# Patient Record
Sex: Male | Born: 1965 | Race: Asian | Hispanic: No | Marital: Married | Smoking: Current some day smoker
Health system: Southern US, Community
[De-identification: ages and names within clinical notes are randomized; demographics above are authoritative.]

## PROBLEM LIST (undated history)

## (undated) DIAGNOSIS — N200 Calculus of kidney: Secondary | ICD-10-CM

## (undated) DIAGNOSIS — G4700 Insomnia, unspecified: Secondary | ICD-10-CM

## (undated) DIAGNOSIS — I10 Essential (primary) hypertension: Secondary | ICD-10-CM

## (undated) DIAGNOSIS — K219 Gastro-esophageal reflux disease without esophagitis: Secondary | ICD-10-CM

## (undated) DIAGNOSIS — E039 Hypothyroidism, unspecified: Secondary | ICD-10-CM

## (undated) HISTORY — DX: Insomnia, unspecified: G47.00

## (undated) HISTORY — DX: Essential (primary) hypertension: I10

## (undated) HISTORY — DX: Gastro-esophageal reflux disease without esophagitis: K21.9

---

## 2010-02-11 ENCOUNTER — Ambulatory Visit: Payer: Self-pay | Admitting: Otolaryngology

## 2010-02-12 ENCOUNTER — Emergency Department: Payer: Self-pay | Admitting: Emergency Medicine

## 2010-02-13 ENCOUNTER — Ambulatory Visit: Payer: Self-pay | Admitting: Otolaryngology

## 2010-03-12 ENCOUNTER — Ambulatory Visit: Payer: Self-pay | Admitting: Otolaryngology

## 2010-04-16 ENCOUNTER — Ambulatory Visit: Payer: Self-pay | Admitting: Otolaryngology

## 2010-05-29 ENCOUNTER — Ambulatory Visit: Payer: Self-pay | Admitting: Internal Medicine

## 2010-10-03 HISTORY — PX: OTHER SURGICAL HISTORY: SHX169

## 2010-10-18 ENCOUNTER — Ambulatory Visit: Payer: Self-pay | Admitting: Otolaryngology

## 2010-10-24 ENCOUNTER — Ambulatory Visit: Payer: Self-pay | Admitting: Otolaryngology

## 2010-10-25 LAB — PATHOLOGY REPORT

## 2010-10-28 HISTORY — PX: OTHER SURGICAL HISTORY: SHX169

## 2013-05-14 ENCOUNTER — Emergency Department: Payer: Self-pay | Admitting: Emergency Medicine

## 2013-05-14 LAB — COMPREHENSIVE METABOLIC PANEL WITH GFR
Albumin: 3.8 g/dL
Alkaline Phosphatase: 66 U/L
Anion Gap: 7
BUN: 29 mg/dL — ABNORMAL HIGH
Bilirubin,Total: 0.8 mg/dL
Calcium, Total: 8.8 mg/dL
Chloride: 108 mmol/L — ABNORMAL HIGH
Co2: 20 mmol/L — ABNORMAL LOW
Creatinine: 1.5 mg/dL — ABNORMAL HIGH
EGFR (African American): >60
EGFR (Non-African Amer.): 55 — ABNORMAL LOW
Glucose: 105 mg/dL — ABNORMAL HIGH
Osmolality: 276
Potassium: 5.1 mmol/L
SGOT(AST): 33 U/L
SGPT (ALT): 27 U/L
Sodium: 135 mmol/L — ABNORMAL LOW
Total Protein: 7.4 g/dL

## 2013-05-14 LAB — URINALYSIS, COMPLETE
Bacteria: NONE SEEN
Bilirubin,UR: NEGATIVE
Blood: NEGATIVE
Glucose,UR: NEGATIVE mg/dL (ref 0–75)
Ketone: NEGATIVE
Ph: 5 (ref 4.5–8.0)
RBC,UR: 1 /HPF (ref 0–5)
Specific Gravity: 1.019 (ref 1.003–1.030)

## 2013-05-14 LAB — CBC
HCT: 40.1 %
HGB: 13.5 g/dL
MCH: 31.3 pg
MCHC: 33.6 g/dL
MCV: 93 fL
Platelet: 270 x10 3/mm 3
RBC: 4.3 x10 6/mm 3 — ABNORMAL LOW
RDW: 13 %
WBC: 10.9 x10 3/mm 3 — ABNORMAL HIGH

## 2013-05-16 ENCOUNTER — Emergency Department: Payer: Self-pay | Admitting: Emergency Medicine

## 2013-05-16 LAB — CBC
HCT: 37.8 % — ABNORMAL LOW (ref 40.0–52.0)
HGB: 13 g/dL (ref 13.0–18.0)
MCH: 31.9 pg (ref 26.0–34.0)
MCHC: 34.4 g/dL (ref 32.0–36.0)
MCV: 93 fL (ref 80–100)
Platelet: 246 10*3/uL (ref 150–440)
RBC: 4.09 10*6/uL — ABNORMAL LOW (ref 4.40–5.90)
RDW: 13 % (ref 11.5–14.5)
WBC: 9.9 10*3/uL (ref 3.8–10.6)

## 2013-05-16 LAB — URINALYSIS, COMPLETE
Bacteria: NONE SEEN
Bilirubin,UR: NEGATIVE
Blood: NEGATIVE
Squamous Epithelial: <1

## 2013-05-16 LAB — BASIC METABOLIC PANEL
Anion Gap: 9 (ref 7–16)
BUN: 21 mg/dL — ABNORMAL HIGH (ref 7–18)
Calcium, Total: 8.9 mg/dL (ref 8.5–10.1)
Co2: 24 mmol/L (ref 21–32)
Creatinine: 1.49 mg/dL — ABNORMAL HIGH (ref 0.60–1.30)
EGFR (African American): >60
Glucose: 98 mg/dL (ref 65–99)
Osmolality: 280 (ref 275–301)
Sodium: 139 mmol/L (ref 136–145)

## 2014-01-10 ENCOUNTER — Ambulatory Visit: Payer: Self-pay | Admitting: Otolaryngology

## 2014-12-14 ENCOUNTER — Other Ambulatory Visit: Payer: Self-pay | Admitting: Internal Medicine

## 2014-12-14 DIAGNOSIS — R51 Headache: Principal | ICD-10-CM

## 2014-12-14 DIAGNOSIS — R519 Headache, unspecified: Secondary | ICD-10-CM

## 2014-12-15 ENCOUNTER — Ambulatory Visit
Admission: RE | Admit: 2014-12-15 | Discharge: 2014-12-15 | Disposition: A | Discharge status: Home / Self Care | Payer: No Typology Code available for payment source | Source: Ambulatory Visit | Attending: Internal Medicine | Admitting: Internal Medicine

## 2014-12-15 ENCOUNTER — Ambulatory Visit: Payer: Self-pay

## 2014-12-15 DIAGNOSIS — R51 Headache: Secondary | ICD-10-CM | POA: Insufficient documentation

## 2014-12-15 DIAGNOSIS — R519 Headache, unspecified: Secondary | ICD-10-CM

## 2014-12-20 ENCOUNTER — Other Ambulatory Visit: Payer: Self-pay | Admitting: Otolaryngology

## 2014-12-20 DIAGNOSIS — E041 Nontoxic single thyroid nodule: Secondary | ICD-10-CM

## 2014-12-21 ENCOUNTER — Other Ambulatory Visit: Payer: Self-pay | Admitting: Internal Medicine

## 2014-12-21 DIAGNOSIS — M2550 Pain in unspecified joint: Secondary | ICD-10-CM

## 2015-01-08 ENCOUNTER — Ambulatory Visit
Admission: RE | Admit: 2015-01-08 | Discharge: 2015-01-08 | Disposition: A | Discharge status: Home / Self Care | Payer: No Typology Code available for payment source | Source: Ambulatory Visit | Attending: Internal Medicine | Admitting: Internal Medicine

## 2015-01-08 DIAGNOSIS — M62838 Other muscle spasm: Secondary | ICD-10-CM | POA: Diagnosis not present

## 2015-01-08 DIAGNOSIS — M2550 Pain in unspecified joint: Secondary | ICD-10-CM

## 2015-01-08 DIAGNOSIS — N200 Calculus of kidney: Secondary | ICD-10-CM | POA: Insufficient documentation

## 2015-01-08 DIAGNOSIS — M549 Dorsalgia, unspecified: Secondary | ICD-10-CM | POA: Diagnosis not present

## 2015-04-19 ENCOUNTER — Ambulatory Visit: Payer: No Typology Code available for payment source

## 2015-05-21 ENCOUNTER — Ambulatory Visit
Admission: RE | Admit: 2015-05-21 | Discharge: 2015-05-21 | Disposition: A | Discharge status: Home / Self Care | Payer: No Typology Code available for payment source | Source: Ambulatory Visit | Attending: Otolaryngology | Admitting: Otolaryngology

## 2015-05-21 DIAGNOSIS — E041 Nontoxic single thyroid nodule: Secondary | ICD-10-CM | POA: Insufficient documentation

## 2015-07-24 ENCOUNTER — Other Ambulatory Visit: Payer: Self-pay

## 2015-07-31 ENCOUNTER — Ambulatory Visit (INDEPENDENT_AMBULATORY_CARE_PROVIDER_SITE_OTHER): Payer: No Typology Code available for payment source | Admitting: Gastroenterology

## 2015-07-31 ENCOUNTER — Encounter: Payer: Self-pay | Admitting: Gastroenterology

## 2015-07-31 ENCOUNTER — Other Ambulatory Visit: Payer: Self-pay

## 2015-07-31 ENCOUNTER — Encounter: Payer: Self-pay | Admitting: *Deleted

## 2015-07-31 ENCOUNTER — Ambulatory Visit: Payer: Self-pay | Admitting: Gastroenterology

## 2015-07-31 VITALS — BP 124/73 | HR 62 | Temp 97.6°F | Ht 72.0 in | Wt 169.4 lb

## 2015-07-31 DIAGNOSIS — E038 Other specified hypothyroidism: Secondary | ICD-10-CM

## 2015-07-31 DIAGNOSIS — K219 Gastro-esophageal reflux disease without esophagitis: Secondary | ICD-10-CM | POA: Diagnosis not present

## 2015-07-31 DIAGNOSIS — R6881 Early satiety: Secondary | ICD-10-CM

## 2015-07-31 DIAGNOSIS — I10 Essential (primary) hypertension: Secondary | ICD-10-CM

## 2015-07-31 DIAGNOSIS — E039 Hypothyroidism, unspecified: Secondary | ICD-10-CM | POA: Insufficient documentation

## 2015-07-31 NOTE — Progress Notes (Signed)
Gastroenterology Consultation  Referring Provider:     Hyman Hopes, MD Primary Care Physician:  Hyman Hopes, MD Primary Gastroenterologist:  Dr. Servando Snare     Reason for Consultation:     Epigastric pain and early satiety with bloating        HPI:   Ernest Malone is a 49 y.o. y/o male referred for consultation & management of epigastric pain with early satiety and bloating by Dr. Lawerance Bach, Shellia Cleverly, MD.  This patient reports that he has epigastric pain which has not been helped with omeprazole. The patient also reports that he feels full shortly after eating. The patient also has a concern that for some time he has been having a suite taste in his mouth. The patient reports that this happens with everything he eats or drinks. He also states that he will return to his wife and reports that his water taste suite. There is no report of any unexplained weight loss. The patient has had significant weight loss in the past but then his reported to gain it right back. There is no report of any dysphagia but he does report that after he eats he feels full very quickly and sometimes will have bloating after he eats. There is no report of any change in bowel habits such as constipation or diarrhea.  Past Medical History  Diagnosis Date  . GERD (gastroesophageal reflux disease)   . Insomnia   . Hypertension     Past Surgical History  Procedure Laterality Date  . Broken nose repair  10/28/2010  . Thyroid nodule removed  10/2010    Dr. Chestine Spore    Prior to Admission medications   Medication Sig Start Date End Date Taking? Authorizing Provider  amLODipine-valsartan (EXFORGE) 10-160 MG tablet  07/07/15  Yes Historical Provider, MD  hydroxypropyl methylcellulose / hypromellose (ISOPTO TEARS / GONIOVISC) 2.5 % ophthalmic solution Administer 1 drop to both eyes as needed.   Yes Historical Provider, MD  levothyroxine (SYNTHROID, LEVOTHROID) 100 MCG tablet Take by mouth.   Yes Historical Provider, MD    omeprazole (PRILOSEC) 40 MG capsule  09/04/14  Yes Historical Provider, MD  tamsulosin (FLOMAX) 0.4 MG CAPS capsule  06/13/15  Yes Historical Provider, MD    Family History  Problem Relation Age of Onset  . Hypertension Father      Social History  Substance Use Topics  . Smoking status: Current Some Day Smoker  . Smokeless tobacco: Never Used  . Alcohol Use: No    Allergies as of 07/31/2015 - never reviewed  Allergen Reaction Noted  . Ace inhibitors  07/24/2015  . Prozac [fluoxetine]  07/24/2015    Review of Systems:    All systems reviewed and negative except where noted in HPI.   Physical Exam:  BP 124/73 mmHg  Pulse 62  Temp(Src) 97.6 F (36.4 C) (Oral)  Ht 6' (1.829 m)  Wt 169 lb 6.4 oz (76.839 kg)  BMI 22.97 kg/m2 No LMP for male patient. Psych:  Alert and cooperative. Normal mood and affect. General:   Alert,  Well-developed, well-nourished, pleasant and cooperative in NAD Head:  Normocephalic and atraumatic. Eyes:  Sclera clear, no icterus.   Conjunctiva pink. Ears:  Normal auditory acuity. Nose:  No deformity, discharge, or lesions. Mouth:  No deformity or lesions,oropharynx pink & moist. Neck:  Supple; no masses or thyromegaly. Lungs:  Respirations even and unlabored.  Clear throughout to auscultation.   No wheezes, crackles, or rhonchi. No acute distress. Heart:  Regular rate and rhythm; no murmurs, clicks, rubs, or gallops. Abdomen:  Normal bowel sounds.  No bruits.  Soft, positive epigastric tenderness and non-distended without masses, hepatosplenomegaly or hernias noted.  No guarding or rebound tenderness.  Negative Carnett sign.   Rectal:  Deferred.  Msk:  Symmetrical without gross deformities.  Good, equal movement & strength bilaterally. Pulses:  Normal pulses noted. Extremities:  No clubbing or edema.  No cyanosis. Neurologic:  Alert and oriented x3;  grossly normal neurologically. Skin:  Intact without significant lesions or rashes.  No  jaundice. Lymph Nodes:  No significant cervical adenopathy. Psych:  Alert and cooperative. Normal mood and affect.  Imaging Studies: No results found.  Assessment and Plan:   [name] is a 49 y.o. y/o male who comes in today with a history of epigastric discomfort and early satiety with bloating. The patient will be started on a trial of Dexilant. The patient will also be set up for an upper endoscopy to rule out any space-occupying lesion as the cause of his early satiety. The patient has also been explained that the suite taste he has when he eats or drinks things are more likely related to his cranial nerves then to his stomach. He denies any burning of his mouth from the acid or bitter taste from bile. The patient will follow up at the time of the upper endoscopy.I have discussed risks & benefits which include, but are not limited to, bleeding, infection, perforation & drug reaction.  The patient agrees with this plan & written consent will be obtained.      Note: This dictation was prepared with Dragon dictation along with smaller phrase technology. Any transcriptional errors that result from this process are unintentional.

## 2015-08-01 NOTE — Discharge Instructions (Signed)

## 2015-08-02 ENCOUNTER — Encounter
Admission: RE | Disposition: A | Discharge status: Home / Self Care | Payer: Self-pay | Source: Ambulatory Visit | Attending: Gastroenterology

## 2015-08-02 ENCOUNTER — Ambulatory Visit: Payer: No Typology Code available for payment source | Admitting: Anesthesiology

## 2015-08-02 ENCOUNTER — Other Ambulatory Visit: Payer: Self-pay | Admitting: Gastroenterology

## 2015-08-02 ENCOUNTER — Ambulatory Visit
Admission: RE | Admit: 2015-08-02 | Discharge: 2015-08-02 | Disposition: A | Discharge status: Home / Self Care | Payer: No Typology Code available for payment source | Source: Ambulatory Visit | Attending: Gastroenterology | Admitting: Gastroenterology

## 2015-08-02 DIAGNOSIS — F17210 Nicotine dependence, cigarettes, uncomplicated: Secondary | ICD-10-CM | POA: Diagnosis not present

## 2015-08-02 DIAGNOSIS — Z87442 Personal history of urinary calculi: Secondary | ICD-10-CM | POA: Diagnosis not present

## 2015-08-02 DIAGNOSIS — I10 Essential (primary) hypertension: Secondary | ICD-10-CM | POA: Insufficient documentation

## 2015-08-02 DIAGNOSIS — K2950 Unspecified chronic gastritis without bleeding: Secondary | ICD-10-CM | POA: Insufficient documentation

## 2015-08-02 DIAGNOSIS — K929 Disease of digestive system, unspecified: Secondary | ICD-10-CM | POA: Insufficient documentation

## 2015-08-02 DIAGNOSIS — R6881 Early satiety: Secondary | ICD-10-CM | POA: Insufficient documentation

## 2015-08-02 DIAGNOSIS — K2980 Duodenitis without bleeding: Secondary | ICD-10-CM | POA: Diagnosis not present

## 2015-08-02 DIAGNOSIS — K2970 Gastritis, unspecified, without bleeding: Secondary | ICD-10-CM | POA: Diagnosis not present

## 2015-08-02 DIAGNOSIS — G4700 Insomnia, unspecified: Secondary | ICD-10-CM | POA: Insufficient documentation

## 2015-08-02 DIAGNOSIS — K30 Functional dyspepsia: Secondary | ICD-10-CM

## 2015-08-02 DIAGNOSIS — R1013 Epigastric pain: Secondary | ICD-10-CM | POA: Diagnosis present

## 2015-08-02 DIAGNOSIS — E039 Hypothyroidism, unspecified: Secondary | ICD-10-CM | POA: Diagnosis not present

## 2015-08-02 DIAGNOSIS — K219 Gastro-esophageal reflux disease without esophagitis: Secondary | ICD-10-CM | POA: Insufficient documentation

## 2015-08-02 DIAGNOSIS — K209 Esophagitis, unspecified: Secondary | ICD-10-CM | POA: Insufficient documentation

## 2015-08-02 HISTORY — DX: Calculus of kidney: N20.0

## 2015-08-02 HISTORY — DX: Hypothyroidism, unspecified: E03.9

## 2015-08-02 HISTORY — PX: ESOPHAGOGASTRODUODENOSCOPY (EGD) WITH PROPOFOL: SHX5813

## 2015-08-02 SURGERY — ESOPHAGOGASTRODUODENOSCOPY (EGD) WITH PROPOFOL
Anesthesia: Monitor Anesthesia Care | Wound class: Clean Contaminated

## 2015-08-02 MED ORDER — OXYCODONE HCL 5 MG PO TABS: 5.0000 mg | ORAL_TABLET | Freq: Once | ORAL | Status: DC | PRN

## 2015-08-02 MED ORDER — PROMETHAZINE HCL 25 MG/ML IJ SOLN: 6.2500 mg | INTRAMUSCULAR | Status: DC | PRN

## 2015-08-02 MED ORDER — HYDROMORPHONE HCL 1 MG/ML IJ SOLN: 0.2500 mg | INTRAMUSCULAR | Status: DC | PRN

## 2015-08-02 MED ORDER — STERILE WATER FOR IRRIGATION IR SOLN
Status: DC | PRN
  Administered 2015-08-02: 10:00:00

## 2015-08-02 MED ORDER — OXYCODONE HCL 5 MG/5ML PO SOLN: 5.0000 mg | Freq: Once | ORAL | Status: DC | PRN

## 2015-08-02 MED ORDER — MEPERIDINE HCL 25 MG/ML IJ SOLN: 6.2500 mg | INTRAMUSCULAR | Status: DC | PRN

## 2015-08-02 MED ORDER — PROPOFOL 10 MG/ML IV BOLUS
INTRAVENOUS | Status: DC | PRN
  Administered 2015-08-02 (×2): 20 mg via INTRAVENOUS
  Administered 2015-08-02: 30 mg via INTRAVENOUS
  Administered 2015-08-02: 50 mg via INTRAVENOUS
  Administered 2015-08-02: 10 mg via INTRAVENOUS
  Administered 2015-08-02: 20 mg via INTRAVENOUS

## 2015-08-02 MED ORDER — LACTATED RINGERS IV SOLN
INTRAVENOUS | Status: DC
  Administered 2015-08-02 (×2): via INTRAVENOUS

## 2015-08-02 MED ORDER — LIDOCAINE HCL (CARDIAC) 20 MG/ML IV SOLN
INTRAVENOUS | Status: DC | PRN
  Administered 2015-08-02: 40 mg via INTRAVENOUS

## 2015-08-02 MED ORDER — GLYCOPYRROLATE 0.2 MG/ML IJ SOLN
INTRAMUSCULAR | Status: DC | PRN
  Administered 2015-08-02: .1 mg via INTRAVENOUS

## 2015-08-02 SURGICAL SUPPLY — 39 items

## 2015-08-02 NOTE — Anesthesia Procedure Notes (Signed)
Procedure Name: MAC Performed by: Reon Hunley Pre-anesthesia Checklist: Patient identified, Emergency Drugs available, Suction available, Timeout performed and Patient being monitored Patient Re-evaluated:Patient Re-evaluated prior to inductionOxygen Delivery Method: Nasal cannula Placement Confirmation: positive ETCO2       

## 2015-08-02 NOTE — H&P (Signed)
  [facility]  [address] [place], [place] [address] Phone: [phone] Fax : [phone]  Primary Care Physician:  Hyman HopesBurns, Harriett P, MD Primary Gastroenterologist:  Dr. Servando SnareWohl  Pre-Procedure History & Physical: HPI:  Ernest Malone is a 49 y.o. male is here for an endoscopy.   Past Medical History  Diagnosis Date  . GERD (gastroesophageal reflux disease)   . Insomnia   . Hypertension   . Kidney stones     hx  . Hypothyroidism     Past Surgical History  Procedure Laterality Date  . Broken nose repair  10/28/2010  . Thyroid nodule removed  10/2010    Dr. Chestine Sporelark    Prior to Admission medications   Medication Sig Start Date End Date Taking? Authorizing Provider  amLODipine-valsartan (EXFORGE) 10-160 MG tablet  07/07/15  Yes Historical Provider, MD  hydroxypropyl methylcellulose / hypromellose (ISOPTO TEARS / GONIOVISC) 2.5 % ophthalmic solution Administer 1 drop to both eyes as needed.   Yes Historical Provider, MD  levothyroxine (SYNTHROID, LEVOTHROID) 100 MCG tablet Take by mouth.   Yes Historical Provider, MD  Omega-3 Fatty Acids (FISH OIL PO) Take by mouth daily.   Yes Historical Provider, MD  omeprazole (PRILOSEC) 40 MG capsule  09/04/14  Yes Historical Provider, MD  tamsulosin (FLOMAX) 0.4 MG CAPS capsule  06/13/15   Historical Provider, MD    Allergies as of 07/31/2015 - Review Complete 07/31/2015  Allergen Reaction Noted  . Ace inhibitors  07/24/2015  . Prozac [fluoxetine]  07/24/2015    Family History  Problem Relation Age of Onset  . Hypertension Father     Social History   Social History  . Marital Status: Married    Spouse Name: N/A  . Number of Children: N/A  . Years of Education: N/A   Occupational History  . Not on file.   Social History Main Topics  . Smoking status: Current Some Day Smoker  . Smokeless tobacco: Never Used     Comment: 1 or cigarettes every few days  . Alcohol Use: No  . Drug Use: No  . Sexual Activity:  Not on file   Other Topics Concern  . Not on file   Social History Narrative    Review of Systems: See HPI, otherwise negative ROS  Physical Exam: BP 122/91 mmHg  Pulse 53  Temp(Src) 97.7 F (36.5 C) (Temporal)  Resp 16  Ht 6' (1.829 m)  Wt 165 lb (74.844 kg)  BMI 22.37 kg/m2  SpO2 99% General:   Alert,  pleasant and cooperative in NAD Head:  Normocephalic and atraumatic. Neck:  Supple; no masses or thyromegaly. Lungs:  Clear throughout to auscultation.    Heart:  Regular rate and rhythm. Abdomen:  Soft, nontender and nondistended. Normal bowel sounds, without guarding, and without rebound.   Neurologic:  Alert and  oriented x4;  grossly normal neurologically.  Impression/Plan: Ernest Malone is here for an endoscopy to be performed for Epigastric pain and early satiety with bloating  Risks, benefits, limitations, and alternatives regarding  endoscopy have been reviewed with the patient.  Questions have been answered.  All parties agreeable.   Darlina Rumpfaren Sequoya Hogsett, MD  08/02/2015, 8:55 AM

## 2015-08-02 NOTE — Anesthesia Postprocedure Evaluation (Signed)
Anesthesia Post Note  Patient: Ernest Malone  Procedure(s) Performed: Procedure(s) (LRB): ESOPHAGOGASTRODUODENOSCOPY (EGD) WITH PROPOFOL (N/A)  Patient location during evaluation: PACU Anesthesia Type: MAC Level of consciousness: awake and alert Pain management: pain level controlled Vital Signs Assessment: post-procedure vital signs reviewed and stable Respiratory status: spontaneous breathing and nonlabored ventilation Cardiovascular status: blood pressure returned to baseline and stable Postop Assessment: no signs of nausea or vomiting and adequate PO intake Anesthetic complications: no    Harolyn RutherfordJoshua Cia Garretson

## 2015-08-02 NOTE — Op Note (Signed)
[facility] Gastroenterology Patient Name: Ernest Malone Procedure Date: 08/02/2015 9:25 AM MRN: [id number] Account #: [id number] Date of Birth: [birth year] Admit Type: Outpatient Age: 49 Room: [facility] OR ROOM 01 Gender: Male Note Status: Finalized Procedure:         Upper GI endoscopy Indications:       Dyspepsia, Early satiety Providers:         Ernest Minium, MD Referring MD:      Myrene Galas, MD (Referring MD) Medicines:         Propofol per Anesthesia Complications:     No immediate complications. Procedure:         Pre-Anesthesia Assessment:                    - Prior to the procedure, a History and Physical was                     performed, and patient medications and allergies were                     reviewed. The patient's tolerance of previous anesthesia                     was also reviewed. The risks and benefits of the procedure                     and the sedation options and risks were discussed with the                     patient. All questions were answered, and informed consent                     was obtained. Prior Anticoagulants: The patient has taken                     no previous anticoagulant or antiplatelet agents. ASA                     Grade Assessment: II - A patient with mild systemic                     disease. After reviewing the risks and benefits, the                     patient was deemed in satisfactory condition to undergo                     the procedure.                    After obtaining informed consent, the endoscope was passed                     under direct vision. Throughout the procedure, the                     patient's blood pressure, pulse, and oxygen saturations                     were monitored continuously. The Olympus GIF H180J                     colonscope (M#:[id number]) was introduced through the mouth,  and advanced to the second part of duodenum. The upper GI   endoscopy was accomplished without difficulty. The patient                     tolerated the procedure well. Findings:      The Z-line was irregular and was found at the gastroesophageal junction.       Biopsies were taken with a cold forceps for histology.      Localized minimal inflammation characterized by erythema was found in       the gastric antrum. Biopsies were taken with a cold forceps for       histology.      Mild inflammation characterized by erythema was found in the first part       of the duodenum. Impression:        - Z-line irregular, at the gastroesophageal junction.                     Biopsied.                    - Gastritis. Biopsied.                    - Duodenitis. Recommendation:    - Await pathology results. Procedure Code(s): --- Professional ---                    [phone], Esophagogastroduodenoscopy, flexible, transoral;                     with biopsy, single or multiple Diagnosis Code(s): --- Professional ---                    K30, Functional dyspepsia                    R68.81, Early satiety                    K22.8, Other specified diseases of esophagus                    K29.70, Gastritis, unspecified, without bleeding                    K29.80, Duodenitis without bleeding CPT copyright 2014 American Medical Association. All rights reserved. The codes documented in this report are preliminary and upon coder review may  be revised to meet current compliance requirements. Ernest Miniumarren Shaymus Eveleth, MD 08/02/2015 9:49:14 AM This report has been signed electronically. Number of Addenda: 0 Note Initiated On: 08/02/2015 9:25 AM Total Procedure Duration: 0 hours 5 minutes 30 seconds       [facility]

## 2015-08-02 NOTE — Anesthesia Preprocedure Evaluation (Signed)
Anesthesia Evaluation  Patient identified by MRN, date of birth, ID band Patient awake    Reviewed: Allergy & Precautions, NPO status , Patient's Chart, lab work & pertinent test results, reviewed documented beta blocker date and time   History of Anesthesia Complications Negative for: history of anesthetic complications  Airway Mallampati: I  TM Distance: >3 FB Neck ROM: Full    Dental no notable dental hx.    Pulmonary Current Smoker,    Pulmonary exam normal        Cardiovascular hypertension, Normal cardiovascular exam     Neuro/Psych negative neurological ROS  negative psych ROS   GI/Hepatic Neg liver ROS, GERD  Medicated,  Endo/Other  Hypothyroidism   Renal/GU negative Renal ROS     Musculoskeletal negative musculoskeletal ROS (+)   Abdominal   Peds  Hematology negative hematology ROS (+)   Anesthesia Other Findings   Reproductive/Obstetrics                             Anesthesia Physical Anesthesia Plan  ASA: II  Anesthesia Plan: MAC   Post-op Pain Management:    Induction: Intravenous  Airway Management Planned:   Additional Equipment:   Intra-op Plan:   Post-operative Plan:   Informed Consent: I have reviewed the patients History and Physical, chart, labs and discussed the procedure including the risks, benefits and alternatives for the proposed anesthesia with the patient or authorized representative who has indicated his/her understanding and acceptance.     Plan Discussed with: CRNA  Anesthesia Plan Comments:         Anesthesia Quick Evaluation

## 2015-08-02 NOTE — Transfer of Care (Signed)
Immediate Anesthesia Transfer of Care Note  Patient: Ernest Malone  Procedure(s) Performed: Procedure(s): ESOPHAGOGASTRODUODENOSCOPY (EGD) WITH PROPOFOL (N/A)  Patient Location: PACU  Anesthesia Type: MAC  Level of Consciousness: awake, alert  and patient cooperative  Airway and Oxygen Therapy: Patient Spontanous Breathing and Patient connected to supplemental oxygen  Post-op Assessment: Post-op Vital signs reviewed, Patient's Cardiovascular Status Stable, Respiratory Function Stable, Patent Airway and No signs of Nausea or vomiting  Post-op Vital Signs: Reviewed and stable  Complications: No apparent anesthesia complications

## 2015-08-03 ENCOUNTER — Encounter: Payer: Self-pay | Admitting: Gastroenterology

## 2015-08-07 ENCOUNTER — Encounter: Payer: Self-pay | Admitting: Gastroenterology

## 2015-10-31 ENCOUNTER — Encounter: Payer: Self-pay | Admitting: Emergency Medicine

## 2015-10-31 ENCOUNTER — Emergency Department
Admission: EM | Admit: 2015-10-31 | Discharge: 2015-10-31 | Disposition: A | Discharge status: Home / Self Care | Payer: BLUE CROSS/BLUE SHIELD | Attending: Emergency Medicine | Admitting: Emergency Medicine

## 2015-10-31 DIAGNOSIS — Y9389 Activity, other specified: Secondary | ICD-10-CM | POA: Insufficient documentation

## 2015-10-31 DIAGNOSIS — Z792 Long term (current) use of antibiotics: Secondary | ICD-10-CM | POA: Insufficient documentation

## 2015-10-31 DIAGNOSIS — I10 Essential (primary) hypertension: Secondary | ICD-10-CM | POA: Insufficient documentation

## 2015-10-31 DIAGNOSIS — Y9289 Other specified places as the place of occurrence of the external cause: Secondary | ICD-10-CM | POA: Insufficient documentation

## 2015-10-31 DIAGNOSIS — H5711 Ocular pain, right eye: Secondary | ICD-10-CM | POA: Diagnosis present

## 2015-10-31 DIAGNOSIS — F17200 Nicotine dependence, unspecified, uncomplicated: Secondary | ICD-10-CM | POA: Diagnosis not present

## 2015-10-31 DIAGNOSIS — H109 Unspecified conjunctivitis: Secondary | ICD-10-CM | POA: Diagnosis not present

## 2015-10-31 DIAGNOSIS — T1501XA Foreign body in cornea, right eye, initial encounter: Secondary | ICD-10-CM | POA: Diagnosis not present

## 2015-10-31 DIAGNOSIS — T1591XA Foreign body on external eye, part unspecified, right eye, initial encounter: Secondary | ICD-10-CM

## 2015-10-31 DIAGNOSIS — Y998 Other external cause status: Secondary | ICD-10-CM | POA: Diagnosis not present

## 2015-10-31 DIAGNOSIS — X58XXXA Exposure to other specified factors, initial encounter: Secondary | ICD-10-CM | POA: Insufficient documentation

## 2015-10-31 DIAGNOSIS — Z79899 Other long term (current) drug therapy: Secondary | ICD-10-CM | POA: Insufficient documentation

## 2015-10-31 MED ORDER — TETRACAINE HCL 0.5 % OP SOLN
OPHTHALMIC | Status: AC
  Administered 2015-10-31: 2 [drp] via OPHTHALMIC
  Filled 2015-10-31: qty 2

## 2015-10-31 MED ORDER — ERYTHROMYCIN 5 MG/GM OP OINT
TOPICAL_OINTMENT | Freq: Once | OPHTHALMIC | Status: AC
  Administered 2015-10-31: 1 via OPHTHALMIC

## 2015-10-31 MED ORDER — FLUORESCEIN SODIUM 1 MG OP STRP
ORAL_STRIP | OPHTHALMIC | Status: AC
  Administered 2015-10-31: 1 via OPHTHALMIC
  Filled 2015-10-31: qty 1

## 2015-10-31 MED ORDER — FLUORESCEIN SODIUM 1 MG OP STRP
1.0000 | ORAL_STRIP | Freq: Once | OPHTHALMIC | Status: AC
  Administered 2015-10-31: 1 via OPHTHALMIC

## 2015-10-31 MED ORDER — ERYTHROMYCIN 5 MG/GM OP OINT
TOPICAL_OINTMENT | OPHTHALMIC | Status: AC
  Administered 2015-10-31: 1 via OPHTHALMIC
  Filled 2015-10-31: qty 1

## 2015-10-31 MED ORDER — TETRACAINE HCL 0.5 % OP SOLN
1.0000 [drp] | Freq: Once | OPHTHALMIC | Status: AC
  Administered 2015-10-31: 2 [drp] via OPHTHALMIC

## 2015-10-31 MED ORDER — ERYTHROMYCIN 5 MG/GM OP OINT: 1.0000 "application " | TOPICAL_OINTMENT | Freq: Three times a day (TID) | OPHTHALMIC | Status: AC

## 2015-10-31 NOTE — ED Provider Notes (Signed)
[facility] Emergency Department Provider Note  ____________________________________________  Time seen: Approximately 541 AM  I have reviewed the triage vital signs and the nursing notes.   HISTORY  Chief Complaint Eye Pain    HPI Ernest Malone is a 50 y.o. male who comes into the hospital today with a concern about infection. He reports that his right eye is painful. He is unsure if there is something in it. His eye started hurting 1 day ago. They tried to get him to see his primary care physician but they could not get in. The patient reports that they came here to get it checked out. He reports that it's been draining clear drainage in his eye is red. He reports it hurts really bad. She has not taken anything for his eye pain. He has eyedrops for dry eyes but that didn't work. He feels as though his eyes swollen and he is having pain in his left upper eyelid.The patient rates his pain a 7 out of 10 in intensity and reports that he is unable to open his eye.   Past Medical History  Diagnosis Date  . GERD (gastroesophageal reflux disease)   . Insomnia   . Hypertension   . Kidney stones     hx  . Hypothyroidism     Patient Active Problem List   Diagnosis Date Noted  . Gastrointestinal functional disorder   . Early satiety   . Gastritis   . Hypertension 07/31/2015  . GERD (gastroesophageal reflux disease) 07/31/2015  . Hypothyroidism 07/31/2015    Past Surgical History  Procedure Laterality Date  . Broken nose repair  10/28/2010  . Thyroid nodule removed  10/2010    Dr. Chestine Spore  . Esophagogastroduodenoscopy (egd) with propofol N/A 08/02/2015    Procedure: ESOPHAGOGASTRODUODENOSCOPY (EGD) WITH PROPOFOL;  Surgeon: Midge Minium, MD;  [facility];  Service: Endoscopy;  Laterality: N/A;    Current Outpatient Rx  Name  Route  Sig  Dispense  Refill  . amLODipine-valsartan (EXFORGE) 10-160 MG tablet               . erythromycin  ophthalmic ointment   Right Eye   Place 1 application into the right eye 3 (three) times daily.   3.5 g   0   . hydroxypropyl methylcellulose / hypromellose (ISOPTO TEARS / GONIOVISC) 2.5 % ophthalmic solution      Administer 1 drop to both eyes as needed.         Marland Kitchen levothyroxine (SYNTHROID, LEVOTHROID) 100 MCG tablet   Oral   Take by mouth.         . Omega-3 Fatty Acids (FISH OIL PO)   Oral   Take by mouth daily.         Marland Kitchen omeprazole (PRILOSEC) 40 MG capsule               . tamsulosin (FLOMAX) 0.4 MG CAPS capsule                 Allergies Ace inhibitors and Prozac  Family History  Problem Relation Age of Onset  . Hypertension Father     Social History Social History  Substance Use Topics  . Smoking status: Current Some Day Smoker  . Smokeless tobacco: Never Used     Comment: 1 or cigarettes every few days  . Alcohol Use: No    Review of Systems Constitutional: No fever/chills Eyes: Right eye pain with eye redness and blurred vision ENT: No sore  throat. Cardiovascular: Denies chest pain. Respiratory: Denies shortness of breath. Gastrointestinal: No abdominal pain.  No nausea, no vomiting.  No diarrhea.  No constipation. Genitourinary: Negative for dysuria. Musculoskeletal: Negative for back pain. Skin: Negative for rash. Neurological: Negative for headaches, focal weakness or numbness.  10-point ROS otherwise negative.  ____________________________________________   PHYSICAL EXAM:  VITAL SIGNS: ED Triage Vitals  Enc Vitals Group     BP 10/31/15 0416 152/100 mmHg     Pulse Rate 10/31/15 0416 65     Resp 10/31/15 0416 20     Temp 10/31/15 0416 98 F (36.7 C)     Temp Source 10/31/15 0416 Oral     SpO2 10/31/15 0416 97 %     Weight 10/31/15 0416 170 lb (77.111 kg)     Height 10/31/15 0416 6' (1.829 m)     Head Cir --      Peak Flow --      Pain Score 10/31/15 0417 7     Pain Loc --      Pain Edu? --      Excl. in GC? --      Constitutional: Alert and oriented. Well appearing and in Mild distress. Eyes: Conjunctivae injected. With some clear drainage, foreign body noted to left corneal area with some scleral injection as well. No uptake seen on staining, no hyphema. Head: Atraumatic. Nose: No congestion/rhinnorhea. Mouth/Throat: Mucous membranes are moist.  Oropharynx non-erythematous. Cardiovascular: Normal rate, regular rhythm. Grossly normal heart sounds.  Good peripheral circulation. Respiratory: Normal respiratory effort.  No retractions. Lungs CTAB. Gastrointestinal: Soft and nontender. No distention. Positive bowel sounds Musculoskeletal: No lower extremity tenderness nor edema.  . Neurologic:  Normal speech and language.  Skin:  Skin is warm, dry and intact. Marland Kitchen. Psychiatric: Mood and affect are normal. .  ____________________________________________   LABS (all labs ordered are listed, but only abnormal results are displayed)  Labs Reviewed - No data to display ____________________________________________  EKG  None ____________________________________________  RADIOLOGY  None ____________________________________________   PROCEDURES  Procedure(s) performed: None  Critical Care performed: No  ____________________________________________   INITIAL IMPRESSION / ASSESSMENT AND PLAN / ED COURSE  Pertinent labs & imaging results that were available during my care of the patient were reviewed by me and considered in my medical decision making (see chart for details).  This is a 50 year old male who comes into the hospital today with some right eye pain. The patient appears to have a foreign body of his right eye. I did place some tetracaine in the patient's eye as well as to fluorescein staining. The patient continues to have the foreign body. I was able to remove it with an 18-gauge needle. Patient now has a pit to his right cornea. I did perform eye pressures and they were 17 and 20.  I will give the patient some erythromycin ointment and have him follow back up with ophthalmology. The patient at this time has no further concerns. I did flip his lid and no other foreign bodies are found. The patient will be discharged home. It is now a 2. ____________________________________________   FINAL CLINICAL IMPRESSION(S) / ED DIAGNOSES  Final diagnoses:  Eye foreign body, right, initial encounter  Conjunctivitis of right eye      Rebecka ApleyAllison P Karel Mowers, MD 10/31/15 0630

## 2015-10-31 NOTE — ED Notes (Signed)
Patient ambulatory to triage with steady gait, without difficulty or distress noted; pt reports right eye pain tonight with no known injury

## 2015-10-31 NOTE — Discharge Instructions (Signed)
Eye Foreign Body  A foreign body refers to any object on the surface of the eye or in the eyeball that should not be there. A foreign body may be a small speck of dirt or dust, a hair or eyelash, a splinter, or any other object.   SIGNS AND SYMPTOMS  Symptoms depend on what the foreign body is and where it is in the eye. The most common locations are:    On the inner surface of the upper or lower eyelids or on the covering of the white part of the eye (conjunctiva). Symptoms in this location are:    Pain and irritation, especially when blinking.    The feeling that something is in the eye.   On the surface of the clear covering on the front of the eye (cornea). Symptoms in this location include:    Pain and irritation.     Small "rust rings" around a metallic foreign body.    The feeling that something is in the eye.    Inside the eyeball. Foreign bodies inside the eye may cause:     Great pain.     Immediate loss of vision.     Distortion of the pupil.  DIAGNOSIS   Foreign bodies are found during an exam by an eye specialist. Those on the eyelids, conjunctiva, or cornea are usually (but not always) easily found. When a foreign body is inside the eyeball, a cloudiness of the lens (cataract) may form almost right away. This makes it hard for an eye specialist to find the foreign body. Tests may be needed, including ultrasound testing, X-rays, and CT scans.  TREATMENT    Foreign bodies on the eyelids, conjunctiva, or cornea are often removed easily and painlessly.   Rust in the cornea may require the use of a drill-like instrument to remove the rust.   If the foreign body has caused a scratch or a rubbing or scraping (abrasion) of the cornea, this may be treated with antibiotic drops or ointment. A pressure patch may be put over your eye.   If the foreign body is inside your eyeball, surgery is needed right away. This is a medical emergency. Foreign bodies inside the eye threaten vision. A person may even  lose his or her eye.  HOME CARE INSTRUCTIONS    Take medicines only as directed by your health care provider. Use eye drops or ointment as directed.   If no eye patch was applied:    Keep your eye closed as much as possible.    Do not rub your eye.    Wear dark glasses as needed to protect your eyes from bright light.    Do not wear contact lenses until your eye feels normal again, or as instructed by your health care provider.    Wear a protective eye covering if there is a risk of eye injury. This is important when working with high-speed tools.   If your eye is patched:    Follow your health care provider's instructions for when to remove the patch.    Do notdrive or operate machinery if your eye is patched. Your ability to judge distances is impaired.   Keep all follow-up visits as directed by your health care provider. This is important.  SEEK MEDICAL CARE IF:    You have increased pain in your eye.   Your vision gets worse.    You have problems with your eye patch.    You have fluid (discharge)   coming from your injured eye.    You have redness and swelling around your affected eye.   MAKE SURE YOU:    Understand these instructions.   Will watch your condition.   Will get help right away if you are not doing well or get worse.     This information is not intended to replace advice given to you by your health care provider. Make sure you discuss any questions you have with your health care provider.     Document Released: 07/21/2005 Document Revised: 08/11/2014 Document Reviewed: 12/16/2012  [employer] Interactive Patient Education 2016 [employer]

## 2015-12-06 ENCOUNTER — Other Ambulatory Visit: Payer: Self-pay | Admitting: Neurology

## 2015-12-06 DIAGNOSIS — R432 Parageusia: Secondary | ICD-10-CM

## 2015-12-26 ENCOUNTER — Ambulatory Visit
Admission: RE | Admit: 2015-12-26 | Discharge: 2015-12-26 | Disposition: A | Discharge status: Home / Self Care | Payer: BLUE CROSS/BLUE SHIELD | Source: Ambulatory Visit | Attending: Neurology | Admitting: Neurology

## 2015-12-26 DIAGNOSIS — R9089 Other abnormal findings on diagnostic imaging of central nervous system: Secondary | ICD-10-CM | POA: Insufficient documentation

## 2015-12-26 DIAGNOSIS — R432 Parageusia: Secondary | ICD-10-CM | POA: Insufficient documentation
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
